# Patient Record
Sex: Female | Born: 1995 | Race: Black or African American | Hispanic: No | Marital: Single | Smoking: Never smoker
Health system: Southern US, Community
[De-identification: ages and names within clinical notes are randomized; demographics above are authoritative.]

## PROBLEM LIST (undated history)

## (undated) DIAGNOSIS — D649 Anemia, unspecified: Secondary | ICD-10-CM

## (undated) HISTORY — PX: ARTHROSCOPIC REPAIR ACL: SUR80

## (undated) HISTORY — PX: ANTERIOR CRUCIATE LIGAMENT REPAIR: SHX115

## (undated) HISTORY — DX: Anemia, unspecified: D64.9

---
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
## 2014-12-22 ENCOUNTER — Ambulatory Visit
Admission: EM | Admit: 2014-12-22 | Discharge: 2014-12-22 | Disposition: A | Discharge status: Home / Self Care | Payer: Self-pay | Attending: Internal Medicine | Admitting: Internal Medicine

## 2014-12-22 ENCOUNTER — Encounter: Payer: Self-pay | Admitting: Gynecology

## 2014-12-22 DIAGNOSIS — K130 Diseases of lips: Secondary | ICD-10-CM

## 2014-12-22 MED ORDER — TRIAMCINOLONE ACETONIDE 0.1 % EX CREA: 1.0000 "application " | TOPICAL_CREAM | Freq: Two times a day (BID) | CUTANEOUS | Status: AC

## 2014-12-22 NOTE — ED Notes (Addendum)
Patient stated that x 1 month discoloration around side of mouth. Per patient itch and burn.

## 2014-12-22 NOTE — ED Provider Notes (Signed)
CSN: [id number]     Arrival date & time 12/22/14  1436 History   First MD Initiated Contact with Patient 12/22/14 1453     Chief Complaint  Patient presents with  . bruise     bruising aroung corner of mouth   The history is provided by the patient. No language interpreter was used.  has this rash since March, no growing in size but does itch, tried Vaseline and some eczema cream but none helped. Had all kind of tests for STDs and were negative.  History reviewed. No pertinent past medical history. Past Surgical History  Procedure Laterality Date  . Arthroscopic repair acl      bilateral knee   No family history on file. History  Substance Use Topics  . Smoking status: Never Smoker   . Smokeless tobacco: Not on file  . Alcohol Use: No   OB History    No data available     Review of Systems  HENT: Negative for congestion, dental problem, ear discharge, ear pain, nosebleeds, sinus pressure, sore throat and trouble swallowing.   Eyes: Negative for pain, discharge and redness.  Respiratory: Negative for cough, chest tightness, shortness of breath and wheezing.   Cardiovascular: Negative for chest pain and palpitations.  Gastrointestinal: Negative for nausea, vomiting, abdominal pain, diarrhea, constipation and abdominal distention.  Endocrine: Negative for cold intolerance and heat intolerance.  Genitourinary: Negative for hematuria, flank pain and difficulty urinating.  Musculoskeletal: Negative for joint swelling, arthralgias, neck pain and neck stiffness.  Skin: Positive for rash (at the corners of her mouth). Negative for color change and wound.  Neurological: Negative for dizziness, tremors, seizures, syncope, speech difficulty, weakness, light-headedness, numbness and headaches.  Hematological: Does not bruise/bleed easily.  Psychiatric/Behavioral: Negative for behavioral problems, confusion, sleep disturbance and agitation. The patient is not nervous/anxious and is not  hyperactive.     Allergies  Augmentin  Home Medications   Prior to Admission medications   Not on File   BP 115/69 mmHg  Pulse 90  Temp(Src) 97.4 F (36.3 C) (Tympanic)  Ht 5\' 7"  (1.702 m)  Wt 165 lb (74.844 kg)  BMI 25.84 kg/m2  SpO2 100%  LMP 12/01/2014 (Approximate) Physical Exam  HENT:  Mouth/Throat: Mucous membranes are dry.    Dry, itchy macular lesions on both corners of mouth - grayish color.  Skin: Rash noted.  Dry, itchy macular lesions on both corners of mouth - grayish color.    ED Course  Procedures (including critical care time) Labs Review Labs Reviewed - No data to display  Imaging Review No results found.   MDM  No diagnosis found. Angular Cheilitis: try steroids cream, if not better in 7-10 days, may need furthur eval with Derm - may need KOH prep    Delfino Lovett, MD 12/22/14 1537

## 2015-08-20 ENCOUNTER — Ambulatory Visit (INDEPENDENT_AMBULATORY_CARE_PROVIDER_SITE_OTHER): Payer: 59 | Admitting: Physician Assistant

## 2015-08-20 VITALS — BP 112/78 | HR 70 | Temp 98.4°F | Resp 16 | Ht 65.0 in | Wt 173.2 lb

## 2015-08-20 DIAGNOSIS — L299 Pruritus, unspecified: Secondary | ICD-10-CM | POA: Diagnosis not present

## 2015-08-20 NOTE — Progress Notes (Signed)
[facility] [address], [place] [address] [phone]  Date:  08/20/2015   Name:  Amy Porter   DOB:  24-Jul-1995   MRN:  [id number]  PCP:  No PCP Per Patient    Chief Complaint: Possible allergic reaction   History of Present Illness:  This is a 20 y.o. female who is presenting with possible allergic reaction. States she has a funnybitter  taste in her mouth and has bumps on both arms. Upper arms were feeling very itchy. Symptoms started 4-5 hours ago. Symptoms started 30 min - 1 hour after eating in the cafeteria. She had chicken, mac n cheese and sweet tea.  Did not take anything for her symptoms. Symptoms are starting to resolve. Denies cp, sob, wheezing, oral swelling. No known allergies. Does not consider herself to have sensitive skin. No hx eczema.  Review of Systems:  Review of Systems See HPI  There are no active problems to display for this patient.   Prior to Admission medications   Medication Sig Start Date End Date Taking? Authorizing Provider  triamcinolone cream (KENALOG) 0.1 % Apply 1 application topically 2 (two) times daily. To affected area Patient not taking: Reported on 08/20/2015 12/22/14   Delfino Lovett, MD    Allergies  Allergen Reactions  . Augmentin [Amoxicillin-Pot Clavulanate] Hives    Past Surgical History  Procedure Laterality Date  . Arthroscopic repair acl      bilateral knee  . Anterior cruciate ligament repair Bilateral 2010 and 2015    Social History  Substance Use Topics  . Smoking status: Never Smoker   . Smokeless tobacco: None  . Alcohol Use: No    History reviewed. No pertinent family history.  Medication list has been reviewed and updated.  Physical Examination:  Physical Exam  Constitutional: She is oriented to person, place, and time. She appears well-developed and well-nourished. No distress.  HENT:  Head: Normocephalic and atraumatic.  Right Ear: Hearing normal.  Left Ear:  Hearing normal.  Nose: Nose normal.  Mouth/Throat: Uvula is midline, oropharynx is clear and moist and mucous membranes are normal. No uvula swelling.  No oral swelling  Eyes: Conjunctivae and lids are normal. Right eye exhibits no discharge. Left eye exhibits no discharge. No scleral icterus.  Cardiovascular: Normal rate, regular rhythm, normal heart sounds and normal pulses.   No murmur heard. Pulmonary/Chest: Effort normal. No respiratory distress. She has no wheezes. She has no rhonchi. She has no rales.  Musculoskeletal: Normal range of motion.  Lymphadenopathy:       Head (right side): No submental, no submandibular and no tonsillar adenopathy present.       Head (left side): No submental, no submandibular and no tonsillar adenopathy present.    She has no cervical adenopathy.  Neurological: She is alert and oriented to person, place, and time.  Skin: Skin is warm, dry and intact.  Erythematous scratch marks over bilateral shoulders.  Psychiatric: She has a normal mood and affect. Her speech is normal and behavior is normal. Thought content normal.   BP 112/78 mmHg  Pulse 70  Temp(Src) 98.4 F (36.9 C) (Oral)  Resp 16  Ht  (1.651 m)  Wt 173 lb 3.2 oz (78.563 kg)  BMI 28.82 kg/m2  SpO2 98%  Assessment and Plan:  1. Itching Unclear etiology of itching. Resolving now. No known allergies. No clear exposure. Advised to keep skin moisturized. [facility]. Discussed red flag symptoms that  would require immediate eval.   [place] Miners. Dyke Brackett, MHS [facility]  08/20/2015

## 2015-08-20 NOTE — Patient Instructions (Signed)
Take benadryl if this happens again If you develop problems breathing, or mouth swelling then return to be seen.

## 2020-11-14 ENCOUNTER — Other Ambulatory Visit: Payer: Self-pay

## 2020-11-14 NOTE — Progress Notes (Deleted)
Presents to [facility] [employer] for on-site pre-employment drug screen.  [facility] Acct #: [facility] Specimen #:

## 2022-12-08 ENCOUNTER — Telehealth: Payer: Self-pay

## 2022-12-08 NOTE — Telephone Encounter (Signed)
Call pt to complete Epi and discuss info provided by Dr. Wyvonnia Lora in 12/08/22 message.  Referral by [facility]: 12/03/22  Positive QFT Gold 12/08/22 Neg Chest X-Ray (Impression: The lungs are clear. Specifically, no findings of active tuberculosis.)

## 2022-12-09 ENCOUNTER — Ambulatory Visit: Payer: Self-pay

## 2022-12-09 VITALS — Ht 66.0 in | Wt 190.0 lb

## 2022-12-09 DIAGNOSIS — R7612 Nonspecific reaction to cell mediated immunity measurement of gamma interferon antigen response without active tuberculosis: Secondary | ICD-10-CM | POA: Insufficient documentation

## 2022-12-09 NOTE — Telephone Encounter (Signed)
Phone call to pt at [phone] ([facility]). Received automated message stating number dialed has been changed, disconnected, or no longer in service. Tried twice.  Phone call to [phone] ([facility] pt demographics). Left voicemail message re referral from her [facility] dr re TB testing. Please call Tavon Corriher at [phone].

## 2022-12-09 NOTE — Telephone Encounter (Signed)
Received return call from pt, [phone]. Epic completed. Dr. Wyvonnia Lora to review and advise.

## 2022-12-09 NOTE — Progress Notes (Signed)
The information documented and confirmed in this document was obtained during a phone interview with the patient.  Ht and wt were also provided by the patient.  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  She did travel to [place] in 2022. When she got back from [place], she tested positive for covid. She was there for a week. She does not remember being around anyone that was obviously coughing or sick. Last PPD was before she went to [place]. This is her only travel out of the [place].  Discussed Active vs Latent TB. Discussed LTBI medication. She would like to think about LTBI medication; not yet sure if she is interested in taking it.  Pt counseled that our [facility] Medical Director would be reviewing medical information, hx, labs, etc. [facility] will reach back out to her with recommendations.

## 2022-12-10 NOTE — Telephone Encounter (Signed)
Phone call to pt. Pt counseled that Dr. Wyvonnia Lora reviewed her chart, info provided, and labs. Dr. Wyvonnia Lora offering a QFT Gold test at no charge through [facility]. Discussed options and info provided by Dr. Wyvonnia Lora below. Pt is interested in having the QFT Gold test repeated at [facility] at no charge.  Lab only appt scheduled for 12/13/22.  (Ref information in message from Dr. Wyvonnia Lora dated 12/10/22: Offer a no charge QFT. If negative, does not have latent TB and will consider the first test a false positive.  If positive, the treatment is optional and we are happy to get her started with Rifampin, 2 pills/600mg  daily for 4 months.  If interested in the QFT please schedule a [facility] Lab visit, no charge QFT per Dr. Wyvonnia Lora in the notes section.)

## 2022-12-13 ENCOUNTER — Other Ambulatory Visit (LOCAL_COMMUNITY_HEALTH_CENTER): Payer: Self-pay

## 2022-12-13 DIAGNOSIS — Z111 Encounter for screening for respiratory tuberculosis: Secondary | ICD-10-CM

## 2022-12-13 NOTE — Progress Notes (Signed)
In nurse clinic for QFT (no charge per Dr. Wyvonnia Lora). ROI signed. Patient walked to lab.   Abagail Kitchens, RN

## 2022-12-17 LAB — QUANTIFERON-TB GOLD PLUS
QuantiFERON Mitogen Value: >10 IU/mL
QuantiFERON Nil Value: 0.02 IU/mL
QuantiFERON TB1 Ag Value: 0.1 IU/mL
QuantiFERON TB2 Ag Value: 0.08 IU/mL
QuantiFERON-TB Gold Plus: NEGATIVE

## 2022-12-17 NOTE — Telephone Encounter (Signed)
Phone call to pt at [phone]. Left voicemail message re negative QFT result from 12/13/22 specimen. Please call Mckaela Howley, nurse with [facility], at [phone].

## 2022-12-20 NOTE — Telephone Encounter (Signed)
Phone call to pt at [phone]. Left message that last TB blood test with [facility] was negative. [facility] Medical Director reviewed prior test, info we discussed (epi), and newest test; was determined that first test was a false positive, no latent TB. Please let us know if  you need any documentation or a letter for employer. Can call Alysse Rathe at [phone] for assistance.

## 2022-12-20 NOTE — Telephone Encounter (Signed)
Received return call from pt. Pt confirmed identity. RN updated MyChart access code to pt's phone on file. Discussed results. Pt initially having issue with MyChart but worked through it and was able to see information she needed. PCP will be following up on the documents she needs for work. Pt will call [facility] if she needs anything further.
